# Patient Record
Sex: Male | Born: 2000
Health system: Southern US, Community
[De-identification: ages and names within clinical notes are randomized; demographics above are authoritative.]

## PROBLEM LIST (undated history)

## (undated) DIAGNOSIS — S43432A Superior glenoid labrum lesion of left shoulder, initial encounter: Secondary | ICD-10-CM

## (undated) DIAGNOSIS — S62109A Fracture of unspecified carpal bone, unspecified wrist, initial encounter for closed fracture: Secondary | ICD-10-CM

## (undated) DIAGNOSIS — S43492A Other sprain of left shoulder joint, initial encounter: Secondary | ICD-10-CM

## (undated) DIAGNOSIS — F909 Attention-deficit hyperactivity disorder, unspecified type: Secondary | ICD-10-CM

## (undated) DIAGNOSIS — S43431A Superior glenoid labrum lesion of right shoulder, initial encounter: Secondary | ICD-10-CM

## (undated) DIAGNOSIS — S43491A Other sprain of right shoulder joint, initial encounter: Secondary | ICD-10-CM

## (undated) HISTORY — PX: TONSILLECTOMY: SUR1361

---
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
## 2020-01-21 ENCOUNTER — Encounter (HOSPITAL_BASED_OUTPATIENT_CLINIC_OR_DEPARTMENT_OTHER): Payer: Self-pay | Admitting: Orthopedic Surgery

## 2020-01-21 ENCOUNTER — Other Ambulatory Visit: Payer: Self-pay

## 2020-01-24 ENCOUNTER — Other Ambulatory Visit (HOSPITAL_COMMUNITY)
Admission: RE | Admit: 2020-01-24 | Discharge: 2020-01-24 | Disposition: A | Discharge status: Home / Self Care | Payer: 59 | Source: Ambulatory Visit | Attending: Orthopedic Surgery | Admitting: Orthopedic Surgery

## 2020-01-24 DIAGNOSIS — Z01812 Encounter for preprocedural laboratory examination: Secondary | ICD-10-CM | POA: Insufficient documentation

## 2020-01-24 DIAGNOSIS — Z20822 Contact with and (suspected) exposure to covid-19: Secondary | ICD-10-CM | POA: Diagnosis not present

## 2020-01-24 LAB — SARS CORONAVIRUS 2 (TAT 6-24 HRS): SARS Coronavirus 2: NEGATIVE

## 2020-01-28 ENCOUNTER — Encounter (HOSPITAL_BASED_OUTPATIENT_CLINIC_OR_DEPARTMENT_OTHER)
Admission: RE | Disposition: A | Discharge status: Home / Self Care | Payer: Self-pay | Source: Home / Self Care | Attending: Orthopedic Surgery

## 2020-01-28 ENCOUNTER — Encounter (HOSPITAL_BASED_OUTPATIENT_CLINIC_OR_DEPARTMENT_OTHER): Payer: Self-pay | Admitting: Orthopedic Surgery

## 2020-01-28 ENCOUNTER — Ambulatory Visit (HOSPITAL_BASED_OUTPATIENT_CLINIC_OR_DEPARTMENT_OTHER)
Admission: RE | Admit: 2020-01-28 | Discharge: 2020-01-28 | Disposition: A | Discharge status: Home / Self Care | Payer: 59 | Attending: Orthopedic Surgery | Admitting: Orthopedic Surgery

## 2020-01-28 ENCOUNTER — Other Ambulatory Visit: Payer: Self-pay

## 2020-01-28 ENCOUNTER — Ambulatory Visit (HOSPITAL_BASED_OUTPATIENT_CLINIC_OR_DEPARTMENT_OTHER): Payer: 59 | Admitting: Anesthesiology

## 2020-01-28 DIAGNOSIS — Z79899 Other long term (current) drug therapy: Secondary | ICD-10-CM | POA: Insufficient documentation

## 2020-01-28 DIAGNOSIS — M25312 Other instability, left shoulder: Secondary | ICD-10-CM | POA: Insufficient documentation

## 2020-01-28 DIAGNOSIS — S43492A Other sprain of left shoulder joint, initial encounter: Secondary | ICD-10-CM | POA: Diagnosis present

## 2020-01-28 DIAGNOSIS — F909 Attention-deficit hyperactivity disorder, unspecified type: Secondary | ICD-10-CM | POA: Insufficient documentation

## 2020-01-28 DIAGNOSIS — S43432A Superior glenoid labrum lesion of left shoulder, initial encounter: Secondary | ICD-10-CM | POA: Diagnosis present

## 2020-01-28 HISTORY — DX: Other sprain of left shoulder joint, initial encounter: S43.492A

## 2020-01-28 HISTORY — DX: Superior glenoid labrum lesion of left shoulder, initial encounter: S43.432A

## 2020-01-28 HISTORY — PX: SHOULDER ARTHROSCOPY WITH BANKART REPAIR: SHX5673

## 2020-01-28 SURGERY — SHOULDER ARTHROSCOPY WITH BANKART REPAIR
Anesthesia: General | Site: Shoulder | Laterality: Left

## 2020-01-28 MED ORDER — CEFAZOLIN SODIUM-DEXTROSE 2-4 GM/100ML-% IV SOLN
INTRAVENOUS | Status: AC
  Filled 2020-01-28: qty 100

## 2020-01-28 MED ORDER — LACTATED RINGERS IV SOLN: INTRAVENOUS | Status: DC

## 2020-01-28 MED ORDER — MIDAZOLAM HCL 2 MG/2ML IJ SOLN
INTRAMUSCULAR | Status: AC
  Filled 2020-01-28: qty 2

## 2020-01-28 MED ORDER — MIDAZOLAM HCL 2 MG/2ML IJ SOLN
2.0000 mg | Freq: Once | INTRAMUSCULAR | Status: AC
  Administered 2020-01-28: 2 mg via INTRAVENOUS

## 2020-01-28 MED ORDER — BUPIVACAINE LIPOSOME 1.3 % IJ SUSP
INTRAMUSCULAR | Status: DC | PRN
  Administered 2020-01-28: 10 mL via PERINEURAL

## 2020-01-28 MED ORDER — FENTANYL CITRATE (PF) 100 MCG/2ML IJ SOLN
100.0000 ug | Freq: Once | INTRAMUSCULAR | Status: AC
  Administered 2020-01-28: 100 ug via INTRAVENOUS

## 2020-01-28 MED ORDER — OXYCODONE HCL 5 MG PO TABS: 5.0000 mg | ORAL_TABLET | Freq: Once | ORAL | Status: DC | PRN

## 2020-01-28 MED ORDER — ONDANSETRON HCL 4 MG/2ML IJ SOLN
INTRAMUSCULAR | Status: AC
  Filled 2020-01-28: qty 2

## 2020-01-28 MED ORDER — ONDANSETRON HCL 4 MG/2ML IJ SOLN
4.0000 mg | Freq: Once | INTRAMUSCULAR | Status: AC | PRN
  Administered 2020-01-28: 4 mg via INTRAVENOUS

## 2020-01-28 MED ORDER — EPHEDRINE 5 MG/ML INJ
INTRAVENOUS | Status: AC
  Filled 2020-01-28: qty 10

## 2020-01-28 MED ORDER — POVIDONE-IODINE 10 % EX SWAB: 2.0000 "application " | Freq: Once | CUTANEOUS | Status: DC

## 2020-01-28 MED ORDER — FENTANYL CITRATE (PF) 100 MCG/2ML IJ SOLN
INTRAMUSCULAR | Status: AC
  Filled 2020-01-28: qty 2

## 2020-01-28 MED ORDER — ROCURONIUM BROMIDE 10 MG/ML (PF) SYRINGE
PREFILLED_SYRINGE | INTRAVENOUS | Status: AC
  Filled 2020-01-28: qty 10

## 2020-01-28 MED ORDER — ROCURONIUM BROMIDE 100 MG/10ML IV SOLN
INTRAVENOUS | Status: DC | PRN
  Administered 2020-01-28: 50 mg via INTRAVENOUS

## 2020-01-28 MED ORDER — OXYCODONE HCL 5 MG PO TABS: 5.0000 mg | ORAL_TABLET | ORAL | 0 refills | Status: DC | PRN

## 2020-01-28 MED ORDER — DEXAMETHASONE SODIUM PHOSPHATE 4 MG/ML IJ SOLN
INTRAMUSCULAR | Status: DC | PRN
  Administered 2020-01-28: 10 mg via INTRAVENOUS

## 2020-01-28 MED ORDER — FENTANYL CITRATE (PF) 100 MCG/2ML IJ SOLN
INTRAMUSCULAR | Status: DC | PRN
  Administered 2020-01-28: 50 ug via INTRAVENOUS

## 2020-01-28 MED ORDER — MEPERIDINE HCL 25 MG/ML IJ SOLN: 6.2500 mg | INTRAMUSCULAR | Status: DC | PRN

## 2020-01-28 MED ORDER — BUPIVACAINE-EPINEPHRINE (PF) 0.5% -1:200000 IJ SOLN
INTRAMUSCULAR | Status: DC | PRN
Start: 2020-01-28 — End: 2020-01-28
  Administered 2020-01-28: 15 mL via PERINEURAL

## 2020-01-28 MED ORDER — PROPOFOL 10 MG/ML IV BOLUS
INTRAVENOUS | Status: AC
  Filled 2020-01-28: qty 20

## 2020-01-28 MED ORDER — SUGAMMADEX SODIUM 200 MG/2ML IV SOLN
INTRAVENOUS | Status: DC | PRN
Start: 2020-01-28 — End: 2020-01-28
  Administered 2020-01-28: 200 mg via INTRAVENOUS

## 2020-01-28 MED ORDER — ACETAMINOPHEN 160 MG/5ML PO SOLN: 325.0000 mg | ORAL | Status: DC | PRN

## 2020-01-28 MED ORDER — MIDAZOLAM HCL 5 MG/5ML IJ SOLN
INTRAMUSCULAR | Status: DC | PRN
  Administered 2020-01-28: 1 mg via INTRAVENOUS

## 2020-01-28 MED ORDER — LIDOCAINE HCL (CARDIAC) PF 100 MG/5ML IV SOSY
PREFILLED_SYRINGE | INTRAVENOUS | Status: DC | PRN
  Administered 2020-01-28: 50 mg via INTRAVENOUS

## 2020-01-28 MED ORDER — ACETAMINOPHEN 325 MG PO TABS: 325.0000 mg | ORAL_TABLET | ORAL | Status: DC | PRN

## 2020-01-28 MED ORDER — POVIDONE-IODINE 7.5 % EX SOLN
Freq: Once | CUTANEOUS | Status: DC
  Filled 2020-01-28: qty 118

## 2020-01-28 MED ORDER — PROMETHAZINE HCL 12.5 MG PO TABS
12.5000 mg | ORAL_TABLET | Freq: Four times a day (QID) | ORAL | 0 refills | Status: DC | PRN
Start: 2020-01-28 — End: 2020-11-04

## 2020-01-28 MED ORDER — PROPOFOL 10 MG/ML IV BOLUS
INTRAVENOUS | Status: DC | PRN
  Administered 2020-01-28: 200 mg via INTRAVENOUS

## 2020-01-28 MED ORDER — EPHEDRINE SULFATE 50 MG/ML IJ SOLN
INTRAMUSCULAR | Status: DC | PRN
  Administered 2020-01-28: 10 mg via INTRAVENOUS

## 2020-01-28 MED ORDER — CEFAZOLIN SODIUM-DEXTROSE 2-4 GM/100ML-% IV SOLN
2.0000 g | INTRAVENOUS | Status: AC
  Administered 2020-01-28: 2 g via INTRAVENOUS

## 2020-01-28 MED ORDER — OXYCODONE HCL 5 MG/5ML PO SOLN: 5.0000 mg | Freq: Once | ORAL | Status: DC | PRN

## 2020-01-28 MED ORDER — FENTANYL CITRATE (PF) 100 MCG/2ML IJ SOLN: 25.0000 ug | INTRAMUSCULAR | Status: DC | PRN

## 2020-01-28 SURGICAL SUPPLY — 67 items
ANCHOR SUT 1.8 FBRTK KNTLS 2SU (Anchor) ×3 IMPLANT
APL SKNCLS STERI-STRIP NONHPOA (GAUZE/BANDAGES/DRESSINGS)
BENZOIN TINCTURE PRP APPL 2/3 (GAUZE/BANDAGES/DRESSINGS) IMPLANT
BLADE EXCALIBUR 4.0X13 (MISCELLANEOUS) IMPLANT
BURR OVAL 8 FLU 5.0X13 (MISCELLANEOUS) IMPLANT
CANNULA 5.75X71 LONG (CANNULA) IMPLANT
CANNULA TWIST IN 8.25X7CM (CANNULA) IMPLANT
COVER WAND RF STERILE (DRAPES) IMPLANT
DECANTER SPIKE VIAL GLASS SM (MISCELLANEOUS) IMPLANT
DISSECTOR  3.8MM X 13CM (MISCELLANEOUS) ×2
DISSECTOR 3.8MM X 13CM (MISCELLANEOUS) ×1 IMPLANT
DRAPE SHOULDER BEACH CHAIR (DRAPES) ×2 IMPLANT
DRAPE U-SHAPE 47X51 STRL (DRAPES) ×4 IMPLANT
DRSG PAD ABDOMINAL 8X10 ST (GAUZE/BANDAGES/DRESSINGS) ×2 IMPLANT
DURAPREP 26ML APPLICATOR (WOUND CARE) ×2 IMPLANT
ELECT REM PT RETURN 9FT ADLT (ELECTROSURGICAL) ×2
ELECTRODE REM PT RTRN 9FT ADLT (ELECTROSURGICAL) ×1 IMPLANT
GAUZE SPONGE 4X4 12PLY STRL (GAUZE/BANDAGES/DRESSINGS) ×2 IMPLANT
GAUZE XEROFORM 1X8 LF (GAUZE/BANDAGES/DRESSINGS) ×2 IMPLANT
GLOVE BIO SURGEON STRL SZ7 (GLOVE) IMPLANT
GLOVE BIOGEL PI IND STRL 7.0 (GLOVE) IMPLANT
GLOVE BIOGEL PI IND STRL 7.5 (GLOVE) ×1 IMPLANT
GLOVE BIOGEL PI INDICATOR 7.0 (GLOVE) ×3
GLOVE BIOGEL PI INDICATOR 7.5 (GLOVE) ×1
GLOVE ECLIPSE 6.5 STRL STRAW (GLOVE) ×1 IMPLANT
GLOVE SS BIOGEL STRL SZ 7.5 (GLOVE) ×1 IMPLANT
GLOVE SUPERSENSE BIOGEL SZ 7.5 (GLOVE) ×1
GOWN STRL REUS W/ TWL LRG LVL3 (GOWN DISPOSABLE) ×2 IMPLANT
GOWN STRL REUS W/ TWL XL LVL3 (GOWN DISPOSABLE) ×2 IMPLANT
GOWN STRL REUS W/TWL LRG LVL3 (GOWN DISPOSABLE) ×4
GOWN STRL REUS W/TWL XL LVL3 (GOWN DISPOSABLE) ×4
KIT PUSHLOCK 2.9 HIP (KITS) IMPLANT
LASSO 90 CVE QUICKPAS (DISPOSABLE) ×2 IMPLANT
LASSO CRESCENT QUICKPASS (SUTURE) IMPLANT
LOOP 2 FIBERLINK CLOSED (SUTURE) IMPLANT
MANIFOLD NEPTUNE II (INSTRUMENTS) ×2 IMPLANT
NDL SAFETY ECLIPSE 18X1.5 (NEEDLE) ×1 IMPLANT
NEEDLE HYPO 18GX1.5 SHARP (NEEDLE) ×2
PACK ARTHROSCOPY DSU (CUSTOM PROCEDURE TRAY) ×2 IMPLANT
PACK BASIN DAY SURGERY FS (CUSTOM PROCEDURE TRAY) ×2 IMPLANT
PENCIL SMOKE EVACUATOR (MISCELLANEOUS) IMPLANT
PORT APPOLLO RF 90DEGREE MULTI (SURGICAL WAND) IMPLANT
SLEEVE SCD COMPRESS KNEE MED (MISCELLANEOUS) IMPLANT
SLING ARM FOAM STRAP LRG (SOFTGOODS) IMPLANT
SLING ULTRA III MED (ORTHOPEDIC SUPPLIES) IMPLANT
SPONGE LAP 4X18 RFD (DISPOSABLE) ×2 IMPLANT
STRIP CLOSURE SKIN 1/2X4 (GAUZE/BANDAGES/DRESSINGS) IMPLANT
SUCTION FRAZIER HANDLE 10FR (MISCELLANEOUS)
SUCTION TUBE FRAZIER 10FR DISP (MISCELLANEOUS) IMPLANT
SUT ETHILON 3 0 PS 1 (SUTURE) ×2 IMPLANT
SUT FIBERWIRE #2 38 T-5 BLUE (SUTURE)
SUT PROLENE 3 0 PS 2 (SUTURE) IMPLANT
SUT VIC AB 0 SH 27 (SUTURE) IMPLANT
SUT VIC AB 2-0 PS2 27 (SUTURE) IMPLANT
SUT VIC AB 2-0 SH 27 (SUTURE)
SUT VIC AB 2-0 SH 27XBRD (SUTURE) IMPLANT
SUTURE FIBERWR #2 38 T-5 BLUE (SUTURE) IMPLANT
SYR 5ML LL (SYRINGE) ×2 IMPLANT
SYR BULB EAR ULCER 3OZ GRN STR (SYRINGE) IMPLANT
TAPE HYPAFIX 6X30 (GAUZE/BANDAGES/DRESSINGS) IMPLANT
TAPE LABRALWHITE 1.5X36 (TAPE) IMPLANT
TAPE STRIPS DRAPE STRL (GAUZE/BANDAGES/DRESSINGS) ×2 IMPLANT
TAPE SUT LABRALTAP WHT/BLK (SUTURE) IMPLANT
TOWEL GREEN STERILE FF (TOWEL DISPOSABLE) ×2 IMPLANT
TUBE CONNECTING 20X1/4 (TUBING) IMPLANT
TUBING ARTHROSCOPY IRRIG 16FT (MISCELLANEOUS) ×2 IMPLANT
WATER STERILE IRR 1000ML POUR (IV SOLUTION) ×2 IMPLANT

## 2020-01-28 NOTE — H&P (Signed)
Tim Briggs is an 19 y.o. male.   Chief Complaint: left shoulder instability HPI: 19 yowm injured left shoulder swimming.  MRI arthogram showed bankart tear.     Past Medical History:  Diagnosis Date  . ADHD (attention deficit hyperactivity disorder)   . Bankart lesion of left shoulder   . Broken wrist    L wrist in 1st grade, R wrist last year    Past Surgical History:  Procedure Laterality Date  . TONSILLECTOMY      History reviewed. No pertinent family history. Social History:  reports that he has never smoked. He has never used smokeless tobacco. He reports that he does not drink alcohol and does not use drugs.  Allergies: No Known Allergies  Medications Prior to Admission  Medication Sig Dispense Refill  . amphetamine-dextroamphetamine (ADDERALL) 5 MG tablet Take 15 mg by mouth every morning.       No results found for this or any previous visit (from the past 48 hour(s)). No results found.  Review of Systems  Constitutional: Negative.   HENT: Negative.   Eyes: Negative.   Respiratory: Negative.   Cardiovascular: Negative.   Gastrointestinal: Negative.   Endocrine: Negative.   Genitourinary: Negative.   Musculoskeletal: Positive for joint swelling.  Skin: Negative.   Allergic/Immunologic: Negative.   Neurological: Negative.   Hematological: Negative.   Psychiatric/Behavioral: Negative.     Blood pressure 126/71, pulse 65, temperature 98 F (36.7 C), temperature source Oral, resp. rate 14, height 6\' 4"  (1.93 m), weight 70.7 kg, SpO2 100 %. Physical Exam Constitutional:      Appearance: Normal appearance. He is normal weight.  HENT:     Head: Normocephalic and atraumatic.     Right Ear: External ear normal.     Left Ear: External ear normal.     Nose: Nose normal.     Mouth/Throat:     Mouth: Mucous membranes are dry.  Eyes:     Extraocular Movements: Extraocular movements intact.     Pupils: Pupils are equal, round, and reactive to light.   Cardiovascular:     Rate and Rhythm: Normal rate and regular rhythm.     Heart sounds: Normal heart sounds.  Pulmonary:     Effort: Pulmonary effort is normal.  Abdominal:     General: Abdomen is flat.     Palpations: Abdomen is soft.  Genitourinary:    Comments: Not pertinent to current symptomatology therefore not examined. Musculoskeletal:        General: Swelling, tenderness and signs of injury present. Normal range of motion.     Cervical back: Neck supple.  Skin:    General: Skin is warm and dry.     Capillary Refill: Capillary refill takes less than 2 seconds.  Neurological:     General: No focal deficit present.     Mental Status: He is alert.  Psychiatric:        Mood and Affect: Mood normal.        Behavior: Behavior normal.      Assessment Principal Problem:   Bankart lesion of left shoulder   Plan Left shoulder arthroscopy with arthroscopic bankart repair.  The risks, benefits, and possible complications of the procedure were discussed in detail with the patient.  The patient is without question.  Donn Wilmot J Kylee Umana, PA-C 01/28/2020, 10:18 AM

## 2020-01-28 NOTE — Discharge Instructions (Signed)
Post Anesthesia Home Care Instructions  Activity: Get plenty of rest for the remainder of the day. A responsible individual must stay with you for 24 hours following the procedure.  For the next 24 hours, DO NOT: -Drive a car -Operate machinery -Drink alcoholic beverages -Take any medication unless instructed by your physician -Make any legal decisions or sign important papers.  Meals: Start with liquid foods such as gelatin or soup. Progress to regular foods as tolerated. Avoid greasy, spicy, heavy foods. If nausea and/or vomiting occur, drink only clear liquids until the nausea and/or vomiting subsides. Call your physician if vomiting continues.  Special Instructions/Symptoms: Your throat may feel dry or sore from the anesthesia or the breathing tube placed in your throat during surgery. If this causes discomfort, gargle with warm salt water. The discomfort should disappear within 24 hours.      Regional Anesthesia Blocks  1. Numbness or the inability to move the "blocked" extremity may last from 3-48 hours after placement. The length of time depends on the medication injected and your individual response to the medication. If the numbness is not going away after 48 hours, call your surgeon.  2. The extremity that is blocked will need to be protected until the numbness is gone and the  Strength has returned. Because you cannot feel it, you will need to take extra care to avoid injury. Because it may be weak, you may have difficulty moving it or using it. You may not know what position it is in without looking at it while the block is in effect.  3. For blocks in the legs and feet, returning to weight bearing and walking needs to be done carefully. You will need to wait until the numbness is entirely gone and the strength has returned. You should be able to move your leg and foot normally before you try and bear weight or walk. You will need someone to be with you when you first try to  ensure you do not fall and possibly risk injury.  4. Bruising and tenderness at the needle site are common side effects and will resolve in a few days.  5. Persistent numbness or new problems with movement should be communicated to the surgeon or the [facility] ([phone])/ [facility] ([phone]).Information for Discharge Teaching: EXPAREL (bupivacaine liposome injectable suspension)   Your surgeon or anesthesiologist gave you EXPAREL(bupivacaine) to help control your pain after surgery.   EXPAREL is a local anesthetic that provides pain relief by numbing the tissue around the surgical site.  EXPAREL is designed to release pain medication over time and can control pain for up to 72 hours.  Depending on how you respond to EXPAREL, you may require less pain medication during your recovery.  Possible side effects:  Temporary loss of sensation or ability to move in the area where bupivacaine was injected.  Nausea, vomiting, constipation  Rarely, numbness and tingling in your mouth or lips, lightheadedness, or anxiety may occur.  Call your doctor right away if you think you may be experiencing any of these sensations, or if you have other questions regarding possible side effects.  Follow all other discharge instructions given to you by your surgeon or nurse. Eat a healthy diet and drink plenty of water or other fluids.  If you return to the hospital for any reason within 96 hours following the administration of EXPAREL, it is important for health care providers to know that you have received this anesthetic. A   teal colored band has been placed on your arm with the date, time and amount of EXPAREL you have received in order to alert and inform your health care providers. Please leave this armband in place for the full 96 hours following administration, and then you may remove the band. 

## 2020-01-28 NOTE — Progress Notes (Signed)
Assisted Dr. Oddono with left, ultrasound guided, interscalene  block. Side rails up, monitors on throughout procedure. See vital signs in flow sheet. Tolerated Procedure well. 

## 2020-01-28 NOTE — Anesthesia Postprocedure Evaluation (Signed)
Anesthesia Post Note  Patient: Tim Briggs  Procedure(s) Performed: SHOULDER ARTHROSCOPY  DEBRIDEMENT WITH BANKART REPAIR (Left Shoulder)     Patient location during evaluation: PACU Anesthesia Type: General Level of consciousness: awake and alert Pain management: pain level controlled Vital Signs Assessment: post-procedure vital signs reviewed and stable Respiratory status: spontaneous breathing, nonlabored ventilation, respiratory function stable and patient connected to nasal cannula oxygen Cardiovascular status: blood pressure returned to baseline and stable Postop Assessment: no apparent nausea or vomiting Anesthetic complications: no   No complications documented.  Last Vitals:  Vitals:   01/28/20 1330 01/28/20 1400  BP: 128/75 118/67  Pulse: 69 70  Resp: 13 16  Temp:  (!) 36.3 C  SpO2: 97% 97%    Last Pain:  Vitals:   01/28/20 1400  TempSrc:   PainSc: 0-No pain                 Corryn Madewell

## 2020-01-28 NOTE — Anesthesia Procedure Notes (Signed)
Anesthesia Regional Block: Interscalene brachial plexus block   Pre-Anesthetic Checklist: ,, timeout performed, Correct Patient, Correct Site, Correct Laterality, Correct Procedure, Correct Position, site marked, Risks and benefits discussed,  Surgical consent,  Pre-op evaluation,  At surgeon's request and post-op pain management  Laterality: Left  Prep: chloraprep       Needles:  Injection technique: Single-shot  Needle Type: Echogenic Stimulator Needle     Needle Length: 5cm  Needle Gauge: 22     Additional Needles:   Procedures:, nerve stimulator,,, ultrasound used (permanent image in chart),,,,   Nerve Stimulator or Paresthesia:  Response: HAND, 0.45 mA,   Additional Responses:   Narrative:  Start time: 01/28/2020 10:24 AM End time: 01/28/2020 10:30 AM Injection made incrementally with aspirations every 5 mL.  Performed by: Personally  Anesthesiologist: Bethena Midget, MD  Additional Notes: Functioning IV was confirmed and monitors were applied.  A 46mm 22ga Arrow echogenic stimulator needle was used. Sterile prep and drape,hand hygiene and sterile gloves were used. Ultrasound guidance: relevant anatomy identified, needle position confirmed, local anesthetic spread visualized around nerve(s)., vascular puncture avoided.  Image printed for medical record. Negative aspiration and negative test dose prior to incremental administration of local anesthetic. The patient tolerated the procedure well.

## 2020-01-28 NOTE — Transfer of Care (Signed)
Immediate Anesthesia Transfer of Care Note  Patient: Tim Briggs  Procedure(s) Performed: SHOULDER ARTHROSCOPY  DEBRIDEMENT WITH BANKART REPAIR (Left Shoulder)  Patient Location: PACU  Anesthesia Type:General  Level of Consciousness: awake, alert  and oriented  Airway & Oxygen Therapy: Patient Spontanous Breathing and Patient connected to nasal cannula oxygen  Post-op Assessment: Report given to RN and Post -op Vital signs reviewed and stable  Post vital signs: Reviewed and stable  Last Vitals:  Vitals Value Taken Time  BP 128/83 01/28/20 1306  Temp    Pulse 70 01/28/20 1307  Resp    SpO2 100 % 01/28/20 1307  Vitals shown include unvalidated device data.  Last Pain:  Vitals:   01/28/20 1000  TempSrc: Oral  PainSc: 0-No pain         Complications: No complications documented.

## 2020-01-28 NOTE — Anesthesia Preprocedure Evaluation (Signed)
Anesthesia Evaluation  Patient identified by MRN, date of birth, ID band Patient awake    Reviewed: Allergy & Precautions, NPO status , Patient's Chart, lab work & pertinent test results  Airway Mallampati: II  TM Distance: >3 FB Neck ROM: Full    Dental no notable dental hx. (+) Teeth Intact   Pulmonary    Pulmonary exam normal breath sounds clear to auscultation       Cardiovascular Normal cardiovascular exam Rhythm:Regular Rate:Normal     Neuro/Psych PSYCHIATRIC DISORDERS ADHD    GI/Hepatic   Endo/Other    Renal/GU      Musculoskeletal   Abdominal   Peds  Hematology   Anesthesia Other Findings   Reproductive/Obstetrics                             Anesthesia Physical Anesthesia Plan  ASA: I  Anesthesia Plan: General   Post-op Pain Management: GA combined w/ Regional for post-op pain   Induction:   PONV Risk Score and Plan: 2 and Ondansetron and Midazolam  Airway Management Planned: Oral ETT  Additional Equipment:   Intra-op Plan:   Post-operative Plan: Extubation in OR  Informed Consent: I have reviewed the patients History and Physical, chart, labs and discussed the procedure including the risks, benefits and alternatives for the proposed anesthesia with the patient or authorized representative who has indicated his/her understanding and acceptance.     Dental advisory given  Plan Discussed with:   Anesthesia Plan Comments: (Discussed both nerve block for pain relief post-op and GA; including NV, sore throat, dental injury, and pulmonary complications)        Anesthesia Quick Evaluation

## 2020-01-28 NOTE — Op Note (Signed)
NAME: EBRAHIM, DEREMER MEDICAL RECORD [id number] ACCOUNT [id number] DATE OF BIRTH:14-Aug-2000 FACILITY: [facility] LOCATION: [facility] PHYSICIAN:Cecilio Ohlrich Salley Slaughter, MD  OPERATIVE REPORT  DATE OF PROCEDURE:  01/28/2020  PREOPERATIVE DIAGNOSIS:  Left shoulder acute traumatic Bankart tear with acute traumatic loose body and instability.  POSTOPERATIVE DIAGNOSIS:  Left shoulder acute traumatic Bankart tear with acute traumatic loose body and instability.   PROCEDURE:   1.  Left shoulder exam under anesthesia, followed by arthroscopically assisted Bankart repair using Arthrex FiberTaks x3. 2.  Left shoulder loose body excision.  SURGEON:  Salvatore Marvel, MD   ASSISTANT:  Julien Girt, PA.  ANESTHESIA:  General.  OPERATIVE TIME:  One hour.  COMPLICATIONS:  None.  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  Exam, x-rays and MRI have revealed a Bankart tear with loose body.  He has failed conservative care  and is now to undergo arthroscopy and repair.  DESCRIPTION OF PROCEDURE:  The patient was brought to the operating room on 01/28/2020 after an interscalene block was placed in the holding room by anesthesia.  He was placed on the operating table in supine position.  He received antibiotics  preoperatively for prophylaxis.  After being placed under general anesthesia, his left shoulder was examined.  He had full range of motion with anterior inferior instability on the left, which he did not have on the right.  No posterior instability.  He  was then placed in a beach chair position and his shoulder and arm was prepped using sterile DuraPrep and draped using sterile technique.  Time-out procedure was called and the correct left shoulder identified.  Initially through a posterior arthroscopic  portal, the arthroscope with a pump attached was placed and through an anterior portal, an  arthroscopic probe was placed.  On initial inspection, the articular cartilage in the glenohumeral joint was intact.  He was found to have a Bankart tear from the  6 o'clock to the 3 o'clock position on the anterior glenoid with a displaced tear.  The superior labrum and biceps tendon anchor and posterior labrum were intact.  The biceps tendon was intact and the rotator cuff was intact.  There was a significant  loose piece of labral tissue as a loose body, which was removed and debrided inferiorly.  At this point, through an accessory anteroinferior portal, the Bankart tear was repaired with 3 separate Arthrex FiberTaks.  Each of these were placed, one in the  5:30, one the 4:30 and one in the 3:30 position on the anteroinferior glenoid rim and each of the sutures were passed arthroscopically and then tightened into position, thus placing the anteroinferior labral capsular tissue back into its anatomic  position with excellent tightness and stability.  At this point, I felt that all pathology had been satisfactorily addressed.  The instruments were removed.  Portals were closed with 3-0 nylon suture.  Sterile dressings were applied and a sling, and the  patient awakened and taken to recovery room in stable condition.    FOLLOWUP CARE:  The patient will be followed as an outpatient on oxycodone and Phenergan.  See him back in the office in a week for sutures out and followup.  VN/NUANCE  D:01/28/2020 T:01/28/2020 JOB:[id number]

## 2020-01-28 NOTE — Anesthesia Procedure Notes (Addendum)
Procedure Name: Intubation Performed by: Cleda Clarks, CRNA Pre-anesthesia Checklist: Patient identified, Emergency Drugs available, Suction available and Patient being monitored Patient Re-evaluated:Patient Re-evaluated prior to induction Oxygen Delivery Method: Circle system utilized Preoxygenation: Pre-oxygenation with 100% oxygen Induction Type: IV induction Ventilation: Mask ventilation without difficulty Laryngoscope Size: Miller and 2 Grade View: Grade I Tube type: Oral Tube size: 7.0 mm Number of attempts: 1 Airway Equipment and Method: Stylet and Oral airway Placement Confirmation: ETT inserted through vocal cords under direct vision,  positive ETCO2 and breath sounds checked- equal and bilateral Secured at: 24 cm Tube secured with: Tape Dental Injury: Teeth and Oropharynx as per pre-operative assessment

## 2020-01-29 ENCOUNTER — Encounter (HOSPITAL_BASED_OUTPATIENT_CLINIC_OR_DEPARTMENT_OTHER): Payer: Self-pay | Admitting: Orthopedic Surgery

## 2020-04-20 NOTE — Op Note (Signed)
NAME: Tim Briggs, Tim Briggs MEDICAL RECORD [id number] ACCOUNT [id number] DATE OF BIRTH:Dec 30, 2000 FACILITY: [facility] LOCATION: [facility] PHYSICIAN:Abbe Bula Salley Slaughter, MD  OPERATIVE REPORT  DATE OF PROCEDURE:  01/28/2020  ADDENDUM  This is an addendum to the operative note from surgery date of 01/28/2020 on this patient for left shoulder instability with Bankart repair and removal of the loose body.  Of note is that the loose body in the patient's shoulder measured 2 x 2 cm and the  anterior portal had to be extended approximately 1 cm to remove this loose body.  VN/NUANCE  D:04/20/2020 T:04/20/2020 JOB:[id number]

## 2020-10-14 ENCOUNTER — Encounter (HOSPITAL_BASED_OUTPATIENT_CLINIC_OR_DEPARTMENT_OTHER): Payer: Self-pay | Admitting: Orthopedic Surgery

## 2020-10-14 DIAGNOSIS — S43491A Other sprain of right shoulder joint, initial encounter: Secondary | ICD-10-CM | POA: Diagnosis present

## 2020-10-14 NOTE — H&P (Signed)
Tim Briggs is an 20 y.o. male.   Chief Complaint: right shoulder instability HPI: "Tim Briggs" is a 20 year old who is seen for a follow-up evaluation from his left shoulder Bankart repair. He is 5 months post operative and is doing well in therapy. His right shoulder continues to bother him as well. He has been in physical therapy for the past four months  on his right shoulder as well. This has a known labral injury also. The right shoulder had significant trauma to it while swimming and playing lacrosse eight months ago. He has been in aggressive physical therapy for the right shoulder. He underwent an MRI arthrogram of the right shoulder on 06-25-20 which revealed a Bankart tear of the anterior/ inferior labrum.    Past Medical History:  Diagnosis Date  . ADHD (attention deficit hyperactivity disorder)   . Bankart lesion of left shoulder   . Bankart lesion of right shoulder   . Broken wrist    L wrist in 1st grade, R wrist last year    Past Surgical History:  Procedure Laterality Date  . SHOULDER ARTHROSCOPY WITH BANKART REPAIR Left 01/28/2020   Procedure: SHOULDER ARTHROSCOPY  DEBRIDEMENT WITH BANKART REPAIR;  Surgeon: Salvatore Marvel, MD;  [facility]:  [facility];  Service: Orthopedics;  Laterality: Left;  SCALENE BLOCK  . TONSILLECTOMY      No family history on file. Social History:  reports that he has never smoked. He has never used smokeless tobacco. He reports that he does not drink alcohol and does not use drugs.  Allergies: No Known Allergies  No medications prior to admission.    No results found for this or any previous visit (from the past 48 hour(s)). No results found.  Review of Systems  Constitutional: Negative.   HENT: Negative.   Eyes: Negative.   Respiratory: Negative.   Cardiovascular: Negative.   Gastrointestinal: Negative.   Endocrine: Negative.   Genitourinary: Negative.   Musculoskeletal:       Right shoulder pain and instability   Allergic/Immunologic: Negative.   Neurological: Negative.   Hematological: Negative.   Psychiatric/Behavioral: Negative.     Blood pressure 112/72, pulse 82, temperature (!) 97.2 F (36.2 C), height 6\' 4"  (1.93 m), weight 73.5 kg. Physical Exam Constitutional:      Appearance: Normal appearance.  HENT:     Head: Normocephalic and atraumatic.     Right Ear: External ear normal.     Left Ear: External ear normal.     Nose: Nose normal.     Mouth/Throat:     Mouth: Mucous membranes are moist.  Eyes:     Pupils: Pupils are equal, round, and reactive to light.  Cardiovascular:     Rate and Rhythm: Normal rate.     Pulses: Normal pulses.  Pulmonary:     Effort: Pulmonary effort is normal.  Abdominal:     Palpations: Abdomen is soft.  Musculoskeletal:     Cervical back: Neck supple.     Comments: Examination of the  left shoulder reveals a  near full range of motion without pain. Strength is 4+/5. No instability. Examination of the right shoulder  revealed significant pain.  Full range of motion. Positive anterior apprehension and subluxability is noted.    Skin:    General: Skin is warm and dry.     Capillary Refill: Capillary refill takes less than 2 seconds.  Neurological:     General: No focal deficit present.     Mental  Status: He is alert.  Psychiatric:        Mood and Affect: Mood normal.        Behavior: Behavior normal.      Assessment Active Problems:   Bankart lesion of right shoulder  Plan  I talked to him and his parents about this in detail. I do think he will need to undergo a right shoulder arthroscopy with Bankart repair but he wants to hold off on this until May when he finishes his freshman  year at [employer]. He will wean out of physical therapy for the next 1-2 weeks for his left shoulder but will continue with rehab exercises for both of his shoulders. We will also get him a SAWA brace for his left shoulder to use when he goes snow skiing.  The risks, complications and benefits of a right shoulder arthroscopic Bankart surgery have been discussed with him in detail and they understand this completely.   Dian Minahan J Kriti Katayama, PA-C 10/14/2020, 10:07 AM

## 2020-11-04 ENCOUNTER — Other Ambulatory Visit: Payer: Self-pay

## 2020-11-04 ENCOUNTER — Encounter (HOSPITAL_BASED_OUTPATIENT_CLINIC_OR_DEPARTMENT_OTHER): Payer: Self-pay | Admitting: Orthopedic Surgery

## 2020-11-10 ENCOUNTER — Other Ambulatory Visit (HOSPITAL_COMMUNITY)
Admission: RE | Admit: 2020-11-10 | Discharge: 2020-11-10 | Disposition: A | Discharge status: Home / Self Care | Payer: 59 | Source: Ambulatory Visit | Attending: Orthopedic Surgery | Admitting: Orthopedic Surgery

## 2020-11-10 DIAGNOSIS — Z20822 Contact with and (suspected) exposure to covid-19: Secondary | ICD-10-CM | POA: Diagnosis not present

## 2020-11-10 DIAGNOSIS — Z01812 Encounter for preprocedural laboratory examination: Secondary | ICD-10-CM | POA: Insufficient documentation

## 2020-11-11 LAB — SARS CORONAVIRUS 2 (TAT 6-24 HRS): SARS Coronavirus 2: NEGATIVE

## 2020-11-12 ENCOUNTER — Encounter (HOSPITAL_BASED_OUTPATIENT_CLINIC_OR_DEPARTMENT_OTHER)
Admission: RE | Disposition: A | Discharge status: Home / Self Care | Payer: Self-pay | Source: Home / Self Care | Attending: Orthopedic Surgery

## 2020-11-12 ENCOUNTER — Other Ambulatory Visit: Payer: Self-pay

## 2020-11-12 ENCOUNTER — Ambulatory Visit (HOSPITAL_BASED_OUTPATIENT_CLINIC_OR_DEPARTMENT_OTHER): Payer: 59 | Admitting: Anesthesiology

## 2020-11-12 ENCOUNTER — Ambulatory Visit (HOSPITAL_BASED_OUTPATIENT_CLINIC_OR_DEPARTMENT_OTHER)
Admission: RE | Admit: 2020-11-12 | Discharge: 2020-11-12 | Disposition: A | Discharge status: Home / Self Care | Payer: 59 | Attending: Orthopedic Surgery | Admitting: Orthopedic Surgery

## 2020-11-12 ENCOUNTER — Encounter (HOSPITAL_BASED_OUTPATIENT_CLINIC_OR_DEPARTMENT_OTHER): Payer: Self-pay | Admitting: Orthopedic Surgery

## 2020-11-12 DIAGNOSIS — S43431A Superior glenoid labrum lesion of right shoulder, initial encounter: Secondary | ICD-10-CM | POA: Diagnosis not present

## 2020-11-12 DIAGNOSIS — M25311 Other instability, right shoulder: Secondary | ICD-10-CM | POA: Diagnosis not present

## 2020-11-12 DIAGNOSIS — S43004A Unspecified dislocation of right shoulder joint, initial encounter: Secondary | ICD-10-CM | POA: Diagnosis not present

## 2020-11-12 DIAGNOSIS — M24011 Loose body in right shoulder: Secondary | ICD-10-CM | POA: Insufficient documentation

## 2020-11-12 DIAGNOSIS — Y9379 Activity, other specified sports and athletics: Secondary | ICD-10-CM | POA: Insufficient documentation

## 2020-11-12 DIAGNOSIS — S43491A Other sprain of right shoulder joint, initial encounter: Secondary | ICD-10-CM | POA: Diagnosis present

## 2020-11-12 HISTORY — DX: Superior glenoid labrum lesion of right shoulder, initial encounter: S43.431A

## 2020-11-12 HISTORY — PX: SHOULDER ARTHROSCOPY WITH CAPSULORRHAPHY: SHX6454

## 2020-11-12 HISTORY — DX: Other sprain of right shoulder joint, initial encounter: S43.491A

## 2020-11-12 SURGERY — SHOULDER ATHROSCOPY WITH CAPSULORRHAPHY
Anesthesia: General | Site: Shoulder | Laterality: Right

## 2020-11-12 MED ORDER — HYDROMORPHONE HCL 1 MG/ML IJ SOLN: 0.2500 mg | INTRAMUSCULAR | Status: DC | PRN

## 2020-11-12 MED ORDER — LACTATED RINGERS IV SOLN: INTRAVENOUS | Status: DC

## 2020-11-12 MED ORDER — PROMETHAZINE HCL 12.5 MG PO TABS: 12.5000 mg | ORAL_TABLET | Freq: Three times a day (TID) | ORAL | 0 refills | Status: AC | PRN

## 2020-11-12 MED ORDER — PROPOFOL 10 MG/ML IV BOLUS
INTRAVENOUS | Status: AC
  Filled 2020-11-12: qty 20

## 2020-11-12 MED ORDER — MIDAZOLAM HCL 2 MG/2ML IJ SOLN
2.0000 mg | Freq: Once | INTRAMUSCULAR | Status: AC
  Administered 2020-11-12: 2 mg via INTRAVENOUS

## 2020-11-12 MED ORDER — BUPIVACAINE HCL (PF) 0.5 % IJ SOLN
INTRAMUSCULAR | Status: DC | PRN
  Administered 2020-11-12: 20 mL via PERINEURAL

## 2020-11-12 MED ORDER — CEFAZOLIN SODIUM-DEXTROSE 2-4 GM/100ML-% IV SOLN
2.0000 g | INTRAVENOUS | Status: AC
  Administered 2020-11-12: 2 g via INTRAVENOUS

## 2020-11-12 MED ORDER — MEPERIDINE HCL 25 MG/ML IJ SOLN: 6.2500 mg | INTRAMUSCULAR | Status: DC | PRN

## 2020-11-12 MED ORDER — AMISULPRIDE (ANTIEMETIC) 5 MG/2ML IV SOLN: 10.0000 mg | Freq: Once | INTRAVENOUS | Status: DC | PRN

## 2020-11-12 MED ORDER — ONDANSETRON HCL 4 MG/2ML IJ SOLN
INTRAMUSCULAR | Status: DC | PRN
  Administered 2020-11-12: 4 mg via INTRAVENOUS

## 2020-11-12 MED ORDER — FENTANYL CITRATE (PF) 100 MCG/2ML IJ SOLN
INTRAMUSCULAR | Status: AC
  Filled 2020-11-12: qty 2

## 2020-11-12 MED ORDER — CEFAZOLIN SODIUM-DEXTROSE 2-4 GM/100ML-% IV SOLN
INTRAVENOUS | Status: AC
  Filled 2020-11-12: qty 100

## 2020-11-12 MED ORDER — LIDOCAINE 2% (20 MG/ML) 5 ML SYRINGE
INTRAMUSCULAR | Status: AC
  Filled 2020-11-12: qty 5

## 2020-11-12 MED ORDER — PROMETHAZINE HCL 25 MG/ML IJ SOLN: 6.2500 mg | INTRAMUSCULAR | Status: DC | PRN

## 2020-11-12 MED ORDER — ROCURONIUM BROMIDE 100 MG/10ML IV SOLN
INTRAVENOUS | Status: DC | PRN
  Administered 2020-11-12: 70 mg via INTRAVENOUS

## 2020-11-12 MED ORDER — LIDOCAINE HCL (CARDIAC) PF 100 MG/5ML IV SOSY
PREFILLED_SYRINGE | INTRAVENOUS | Status: DC | PRN
  Administered 2020-11-12: 40 mg via INTRAVENOUS

## 2020-11-12 MED ORDER — FENTANYL CITRATE (PF) 100 MCG/2ML IJ SOLN
100.0000 ug | Freq: Once | INTRAMUSCULAR | Status: AC
  Administered 2020-11-12: 100 ug via INTRAVENOUS

## 2020-11-12 MED ORDER — FENTANYL CITRATE (PF) 100 MCG/2ML IJ SOLN
INTRAMUSCULAR | Status: DC | PRN
  Administered 2020-11-12: 50 ug via INTRAVENOUS

## 2020-11-12 MED ORDER — OXYCODONE HCL 5 MG/5ML PO SOLN: 5.0000 mg | Freq: Once | ORAL | Status: DC | PRN

## 2020-11-12 MED ORDER — OXYCODONE HCL 5 MG PO TABS: 5.0000 mg | ORAL_TABLET | ORAL | 0 refills | Status: AC | PRN

## 2020-11-12 MED ORDER — POVIDONE-IODINE 10 % EX SWAB
2.0000 "application " | Freq: Once | CUTANEOUS | Status: AC
  Administered 2020-11-12: 2 via TOPICAL

## 2020-11-12 MED ORDER — BUPIVACAINE LIPOSOME 1.3 % IJ SUSP
INTRAMUSCULAR | Status: DC | PRN
  Administered 2020-11-12: 10 mL via PERINEURAL

## 2020-11-12 MED ORDER — PROPOFOL 10 MG/ML IV BOLUS
INTRAVENOUS | Status: DC | PRN
  Administered 2020-11-12: 150 mg via INTRAVENOUS

## 2020-11-12 MED ORDER — ONDANSETRON HCL 4 MG/2ML IJ SOLN
INTRAMUSCULAR | Status: AC
  Filled 2020-11-12: qty 2

## 2020-11-12 MED ORDER — SUGAMMADEX SODIUM 200 MG/2ML IV SOLN
INTRAVENOUS | Status: DC | PRN
  Administered 2020-11-12: 150 mg via INTRAVENOUS

## 2020-11-12 MED ORDER — ROCURONIUM BROMIDE 10 MG/ML (PF) SYRINGE
PREFILLED_SYRINGE | INTRAVENOUS | Status: AC
  Filled 2020-11-12: qty 10

## 2020-11-12 MED ORDER — DEXAMETHASONE SODIUM PHOSPHATE 10 MG/ML IJ SOLN
8.0000 mg | Freq: Once | INTRAMUSCULAR | Status: AC
  Administered 2020-11-12: 8 mg via INTRAVENOUS

## 2020-11-12 MED ORDER — OXYCODONE HCL 5 MG PO TABS: 5.0000 mg | ORAL_TABLET | Freq: Once | ORAL | Status: DC | PRN

## 2020-11-12 MED ORDER — MIDAZOLAM HCL 2 MG/2ML IJ SOLN
INTRAMUSCULAR | Status: AC
  Filled 2020-11-12: qty 2

## 2020-11-12 MED ORDER — DEXAMETHASONE SODIUM PHOSPHATE 10 MG/ML IJ SOLN
INTRAMUSCULAR | Status: AC
  Filled 2020-11-12: qty 1

## 2020-11-12 SURGICAL SUPPLY — 46 items
ANCHOR SUT 1.8 FBRTK KNTLS 2SU (Anchor) ×3 IMPLANT
BLADE CLIPPER SURG (BLADE) IMPLANT
BLADE EXCALIBUR 4.0X13 (MISCELLANEOUS) IMPLANT
BNDG COHESIVE 4X5 TAN STRL (GAUZE/BANDAGES/DRESSINGS) IMPLANT
BURR OVAL 8 FLU 5.0X13 (MISCELLANEOUS) IMPLANT
CANNULA 5.75X71 LONG (CANNULA) ×2 IMPLANT
CANNULA TWIST IN 8.25X7CM (CANNULA) ×2 IMPLANT
COVER WAND RF STERILE (DRAPES) IMPLANT
DISSECTOR  3.8MM X 13CM (MISCELLANEOUS) ×2
DISSECTOR 3.8MM X 13CM (MISCELLANEOUS) ×1 IMPLANT
DRAPE SHOULDER BEACH CHAIR (DRAPES) ×2 IMPLANT
DRAPE U-SHAPE 47X51 STRL (DRAPES) ×4 IMPLANT
DRSG PAD ABDOMINAL 8X10 ST (GAUZE/BANDAGES/DRESSINGS) ×2 IMPLANT
DURAPREP 26ML APPLICATOR (WOUND CARE) ×2 IMPLANT
GAUZE SPONGE 4X4 12PLY STRL (GAUZE/BANDAGES/DRESSINGS) ×2 IMPLANT
GAUZE XEROFORM 1X8 LF (GAUZE/BANDAGES/DRESSINGS) ×2 IMPLANT
GLOVE SURG ENC MOIS LTX SZ7 (GLOVE) ×1 IMPLANT
GLOVE SURG MICRO LTX SZ7.5 (GLOVE) ×2 IMPLANT
GLOVE SURG UNDER POLY LF SZ7 (GLOVE) ×1 IMPLANT
GLOVE SURG UNDER POLY LF SZ7.5 (GLOVE) ×2 IMPLANT
GOWN STRL REUS W/ TWL LRG LVL3 (GOWN DISPOSABLE) ×2 IMPLANT
GOWN STRL REUS W/ TWL XL LVL3 (GOWN DISPOSABLE) ×1 IMPLANT
GOWN STRL REUS W/TWL LRG LVL3 (GOWN DISPOSABLE) ×2
GOWN STRL REUS W/TWL XL LVL3 (GOWN DISPOSABLE) ×4
KIT STR SPEAR 1.8 FBRTK DISP (KITS) ×1 IMPLANT
LASSO 90 CVE QUICKPAS (DISPOSABLE) ×1 IMPLANT
LASSO CRESCENT QUICKPASS (SUTURE) IMPLANT
MANIFOLD NEPTUNE II (INSTRUMENTS) ×2 IMPLANT
NDL SAFETY ECLIPSE 18X1.5 (NEEDLE) ×1 IMPLANT
NEEDLE HYPO 18GX1.5 SHARP (NEEDLE) ×2
PACK ARTHROSCOPY DSU (CUSTOM PROCEDURE TRAY) ×2 IMPLANT
PACK BASIN DAY SURGERY FS (CUSTOM PROCEDURE TRAY) ×2 IMPLANT
PAD ALCOHOL SWAB (MISCELLANEOUS) ×4 IMPLANT
PORT APPOLLO RF 90DEGREE MULTI (SURGICAL WAND) IMPLANT
SLEEVE SCD COMPRESS KNEE MED (STOCKING) IMPLANT
SLING ARM FOAM STRAP LRG (SOFTGOODS) IMPLANT
SLING ULTRA III MED (ORTHOPEDIC SUPPLIES) IMPLANT
SUT ETHILON 3 0 PS 1 (SUTURE) ×2 IMPLANT
SUT PROLENE 3 0 PS 2 (SUTURE) IMPLANT
SYR 5ML LL (SYRINGE) ×2 IMPLANT
TAPE HYPAFIX 6X30 (GAUZE/BANDAGES/DRESSINGS) IMPLANT
TAPE STRIPS DRAPE STRL (GAUZE/BANDAGES/DRESSINGS) ×2 IMPLANT
TAPE SUT LABRALTAP WHT/BLK (SUTURE) IMPLANT
TOWEL GREEN STERILE FF (TOWEL DISPOSABLE) ×4 IMPLANT
TUBE CONNECTING 20X1/4 (TUBING) ×3 IMPLANT
TUBING ARTHROSCOPY IRRIG 16FT (MISCELLANEOUS) ×2 IMPLANT

## 2020-11-12 NOTE — Interval H&P Note (Signed)
History and Physical Interval Note:  11/12/2020 12:19 PM  Tim Briggs  has presented today for surgery, with the diagnosis of RIGHT SHOULDER DISLOCATION.  The various methods of treatment have been discussed with the patient and family. After consideration of risks, benefits and other options for treatment, the patient has consented to  Procedure(s): RIGHT SHOULDER ATHROSCOPY WITH CAPSULORRHAPHY (Right) as a surgical intervention.  The patient's history has been reviewed, patient examined, no change in status, stable for surgery.  I have reviewed the patient's chart and labs.  Questions were answered to the patient's satisfaction.     Nilda Simmer

## 2020-11-12 NOTE — Anesthesia Procedure Notes (Signed)
Procedure Name: Intubation Date/Time: 11/12/2020 12:43 PM Performed by: Thornell Mule, CRNA Pre-anesthesia Checklist: Patient identified, Emergency Drugs available, Suction available and Patient being monitored Patient Re-evaluated:Patient Re-evaluated prior to induction Oxygen Delivery Method: Circle system utilized Preoxygenation: Pre-oxygenation with 100% oxygen Induction Type: IV induction Ventilation: Mask ventilation without difficulty Laryngoscope Size: Miller and 3 Grade View: Grade I Tube type: Oral Tube size: 7.5 mm Number of attempts: 1 Airway Equipment and Method: Stylet and Oral airway Placement Confirmation: ETT inserted through vocal cords under direct vision,  positive ETCO2 and breath sounds checked- equal and bilateral Secured at: 23 cm Tube secured with: Tape Dental Injury: Teeth and Oropharynx as per pre-operative assessment

## 2020-11-12 NOTE — Discharge Instructions (Signed)
[facility]ou[facility] arthroscopy and Labral Repair (Bankart and/or SLAP) Care After Instructions Refer to this sheet in the next few weeks. These discharge instructions provide you with general information on caring for yourself after you leave the hospital. Your caregiver may also give you specific instructions. Your treatment has been planned according to the most current medical practices available, but unavoidable complications sometimes occur. If you have any problems or questions after discharge, please call your caregiver. HOME INSTRUCTIONS You may resume a normal diet and activities as directed.  You will start physical therapy 4 weeks after surgery Take showers instead of baths until informed otherwise.  Change bandages (dressings) in 3 days.  Swab wounds daily with betadine.  Wash shoulder with soap and water.  Pat dry.  Cover wounds with bandaids. Only take over-the-counter or prescription medicines for pain, discomfort, or fever as directed by your caregiver. DO NOT TAKE ANY ANTI-INFLAMMATORIES.  (NO ADVIL, IBUPROFEN, ALEVE, NAPROSYN) Wear your sling for the next 6 weeks unless otherwise instructed. Eat a well-balanced diet.  Avoid lifting or driving until you are instructed otherwise.  Make an appointment to see your caregiver for stitches (suture) or staple removal as directed.   SEEK MEDICAL CARE IF: You have swelling of your calf or leg.  You develop shortness of breath or chest pain.  You have redness, swelling, or increasing pain in the wound.  There is pus or any unusual drainage coming from the surgical site.  You notice a bad smell coming from the surgical site or dressing.  The surgical site breaks open after sutures or staples have been removed.  There is persistent bleeding from the suture or staple line.  You are getting worse or are not improving.  You have any other questions or concerns.  SEEK IMMEDIATE MEDICAL CARE IF:  You have a fever greater than 101 You develop a rash.   You have difficulty breathing.  You develop any reaction or side effects to medicines given.  Your knee motion is decreasing rather than improving.  MAKE SURE YOU:  Understand these instructions.  Will watch your condition.  Will get help right away if you are not doing well or get worse.  Post Anesthesia Home Care Instructions  Activity: Get plenty of rest for the remainder of the day. A responsible individual must stay with you for 24 hours following the procedure.  For the next 24 hours, DO NOT: -Drive a car -Advertising copywriter -Drink alcoholic beverages -Take any medication unless instructed by your physician -Make any legal decisions or sign important papers.  Meals: Start with liquid foods such as gelatin or soup. Progress to regular foods as tolerated. Avoid greasy, spicy, heavy foods. If nausea and/or vomiting occur, drink only clear liquids until the nausea and/or vomiting subsides. Call your physician if vomiting continues.  Special Instructions/Symptoms: Your throat may feel dry or sore from the anesthesia or the breathing tube placed in your throat during surgery. If this causes discomfort, gargle with warm salt water. The discomfort should disappear within 24 hours.  If you had a scopolamine patch placed behind your ear for the management of post- operative nausea and/or vomiting:  1. The medication in the patch is effective for 72 hours, after which it should be removed.  Wrap patch in a tissue and discard in the trash. Wash hands thoroughly with soap and water. 2. You may remove the patch earlier than 72 hours if you experience unpleasant side effects which may include dry mouth, dizziness or visual  disturbances. 3. Avoid touching the patch. Wash your hands with soap and water after contact with the patch.   Regional Anesthesia Blocks  1. Numbness or the inability to move the "blocked" extremity may last from 3-48 hours after placement. The length of time depends on  the medication injected and your individual response to the medication. If the numbness is not going away after 48 hours, call your surgeon.  2. The extremity that is blocked will need to be protected until the numbness is gone and the  Strength has returned. Because you cannot feel it, you will need to take extra care to avoid injury. Because it may be weak, you may have difficulty moving it or using it. You may not know what position it is in without looking at it while the block is in effect.  3. For blocks in the legs and feet, returning to weight bearing and walking needs to be done carefully. You will need to wait until the numbness is entirely gone and the strength has returned. You should be able to move your leg and foot normally before you try and bear weight or walk. You will need someone to be with you when you first try to ensure you do not fall and possibly risk injury.  4. Bruising and tenderness at the needle site are common side effects and will resolve in a few days.  5. Persistent numbness or new problems with movement should be communicated to the surgeon or the [facility] [phone] [facility] [phone]).    Information for Discharge Teaching: EXPAREL (bupivacaine liposome injectable suspension)   Your surgeon or anesthesiologist gave you EXPAREL(bupivacaine) to help control your pain after surgery.   EXPAREL is a local anesthetic that provides pain relief by numbing the tissue around the surgical site.  EXPAREL is designed to release pain medication over time and can control pain for up to 72 hours.  Depending on how you respond to EXPAREL, you may require less pain medication during your recovery.  Possible side effects:  Temporary loss of sensation or ability to move in the area where bupivacaine was injected.  Nausea, vomiting, constipation  Rarely, numbness and tingling in your mouth or lips, lightheadedness, or anxiety may  occur.  Call your doctor right away if you think you may be experiencing any of these sensations, or if you have other questions regarding possible side effects.  Follow all other discharge instructions given to you by your surgeon or nurse. Eat a healthy diet and drink plenty of water or other fluids.  If you return to the hospital for any reason within 96 hours following the administration of EXPAREL, it is important for health care providers to know that you have received this anesthetic. A teal colored band has been placed on your arm with the date, time and amount of EXPAREL you have received in order to alert and inform your health care providers. Please leave this armband in place for the full 96 hours following administration, and then you may remove the band.

## 2020-11-12 NOTE — Anesthesia Preprocedure Evaluation (Addendum)
Anesthesia Evaluation  Patient identified by MRN, date of birth, ID band Patient awake    Reviewed: Allergy & Precautions, NPO status , Patient's Chart, lab work & pertinent test results  Airway Mallampati: II  TM Distance: >3 FB Neck ROM: Full    Dental no notable dental hx. (+) Teeth Intact   Pulmonary    Pulmonary exam normal breath sounds clear to auscultation       Cardiovascular Normal cardiovascular exam Rhythm:Regular Rate:Normal     Neuro/Psych PSYCHIATRIC DISORDERS ADHD    GI/Hepatic   Endo/Other    Renal/GU      Musculoskeletal   Abdominal   Peds  (+) ADHD Hematology   Anesthesia Other Findings   Reproductive/Obstetrics                             Anesthesia Physical  Anesthesia Plan  ASA: II  Anesthesia Plan: General   Post-op Pain Management: GA combined w/ Regional for post-op pain   Induction:   PONV Risk Score and Plan: 2 and Ondansetron and Midazolam  Airway Management Planned: Oral ETT  Additional Equipment:   Intra-op Plan:   Post-operative Plan: Extubation in OR  Informed Consent: I have reviewed the patients History and Physical, chart, labs and discussed the procedure including the risks, benefits and alternatives for the proposed anesthesia with the patient or authorized representative who has indicated his/her understanding and acceptance.     Dental advisory given  Plan Discussed with:   Anesthesia Plan Comments: (Discussed both nerve block for pain relief post-op and GA; including NV, sore throat, dental injury, and pulmonary complications)        Anesthesia Quick Evaluation

## 2020-11-12 NOTE — Anesthesia Postprocedure Evaluation (Signed)
Anesthesia Post Note  Patient: Tim Briggs  Procedure(s) Performed: RIGHT SHOULDER ATHROSCOPY WITH CAPSULORRHAPHY (Right Shoulder)     Patient location during evaluation: PACU Anesthesia Type: General and Regional Level of consciousness: awake and alert Pain management: pain level controlled Vital Signs Assessment: post-procedure vital signs reviewed and stable Respiratory status: spontaneous breathing, nonlabored ventilation, respiratory function stable and patient connected to nasal cannula oxygen Cardiovascular status: blood pressure returned to baseline and stable Postop Assessment: no apparent nausea or vomiting Anesthetic complications: no   No complications documented.  Last Vitals:  Vitals:   11/12/20 1415 11/12/20 1431  BP: 109/72 110/70  Pulse: (!) 47 61  Resp: 12 16  Temp:    SpO2: 100% 100%    Last Pain:  Vitals:   11/12/20 1431  TempSrc:   PainSc: 0-No pain                 [name] L Xiadani Damman

## 2020-11-12 NOTE — Transfer of Care (Signed)
Immediate Anesthesia Transfer of Care Note  Patient: Tim Briggs  Procedure(s) Performed: RIGHT SHOULDER ATHROSCOPY WITH CAPSULORRHAPHY (Right Shoulder)  Patient Location: PACU  Anesthesia Type:General  Level of Consciousness: drowsy, patient cooperative and responds to stimulation  Airway & Oxygen Therapy: Patient Spontanous Breathing and Patient connected to face mask oxygen  Post-op Assessment: Report given to RN and Post -op Vital signs reviewed and stable  Post vital signs: Reviewed and stable  Last Vitals:  Vitals Value Taken Time  BP 119/71 11/12/20 1348  Temp    Pulse 70 11/12/20 1349  Resp 13 11/12/20 1349  SpO2 100 % 11/12/20 1349  Vitals shown include unvalidated device data.  Last Pain:  Vitals:   11/12/20 1126  TempSrc: Oral  PainSc: 0-No pain      Patients Stated Pain Goal: 4 (11/12/20 1126)  Complications: No complications documented.

## 2020-11-12 NOTE — Progress Notes (Signed)
Assisted Dr. Miller with right, ultrasound guided, interscalene  block. Side rails up, monitors on throughout procedure. See vital signs in flow sheet. Tolerated Procedure well. 

## 2020-11-12 NOTE — Anesthesia Procedure Notes (Signed)
Anesthesia Regional Block: Interscalene brachial plexus block   Pre-Anesthetic Checklist: ,, timeout performed, Correct Patient, Correct Site, Correct Laterality, Correct Procedure, Correct Position, site marked, Risks and benefits discussed,  Surgical consent,  Pre-op evaluation,  At surgeon's request and post-op pain management  Laterality: Right  Prep: chloraprep       Needles:  Injection technique: Single-shot  Needle Type: Stimiplex     Needle Length: 9cm  Needle Gauge: 21     Additional Needles:   Procedures:,,,, ultrasound used (permanent image in chart),,,,  Narrative:  Start time: 11/12/2020 12:04 PM End time: 11/12/2020 12:09 PM Injection made incrementally with aspirations every 5 mL.  Performed by: Personally  Anesthesiologist: Lowella Curb, MD

## 2020-11-13 ENCOUNTER — Encounter (HOSPITAL_BASED_OUTPATIENT_CLINIC_OR_DEPARTMENT_OTHER): Payer: Self-pay | Admitting: Orthopedic Surgery

## 2020-11-13 NOTE — Op Note (Signed)
NAME: Tim Briggs, EASLER MEDICAL RECORD NO: [id number] ACCOUNT NO: [id number] DATE OF BIRTH: 24-Sep-2000 FACILITY: [facility] LOCATION: [facility] PHYSICIAN: Elana Alm. Thurston Hole, MD  Operative Report   DATE OF PROCEDURE: 11/12/2020  PREOPERATIVE DIAGNOSIS:   1.  Right shoulder acute traumatic dislocation with anterior Bankart tear. 2.  Right shoulder acute traumatic loose body.  POSTOPERATIVE DIAGNOSIS:   1.  Right shoulder acute traumatic dislocation with anterior Bankart tear. 2.  Right shoulder acute traumatic loose body.  PROCEDURE PERFORMED:   1.  Right shoulder EUA followed by arthroscopically assisted Bankart repair using Arthrex FiberTak anchors x3. 2.  Right shoulder loose body excision.  SURGEON:  Elana Alm. Thurston Hole, MD   ASSISTANT:  Julien Girt, PA  ANESTHESIA:  General.  OPERATIVE TIME:  1 hour.  COMPLICATIONS:  None.  INDICATIONS FOR PROCEDURE: This is a 20 year old who sustained a right shoulder dislocation playing sports over the last eight months.  Exam, x-rays and MRI have revealed an anterior Bankart tear with loose body.  He has failed conservative care, and is  now to undergo arthroscopy and Bankart repair.  DESCRIPTION OF PROCEDURE:  He was brought to the operating room on 11/12/2020 after an interscalene block, was placed in the holding room by Anesthesia.  He was placed on the operating table in supine position.  After being placed under general  anesthesia, his right shoulder was examined.  He had full range of motion with anterior inferior instability on the right, which he did not have on the left.  No posterior instability.  He was then placed in a beach chair position and the shoulder and  arm was prepped in the usual sterile DuraPrep and draped using sterile technique.  Timeout procedure was called and the correct right shoulder identified.  Initially, the posterior arthroscopic portal, the arthroscope with a pump attached was placed in  through an anterior  portal.  An arthroscopic probe was placed.  On initial inspection, the articular cartilage in the glenohumeral joint was intact.  He had a significant Bankart tear and labrum tear in the anterior inferior region of the glenoid from  the 5:30 up to the 2 o'clock position with significant instability.  Superior labrum and biceps tendon anchor was intact.  The biceps tendon was intact.  The posterior labrum was intact.  There were loose bodies of labral tissue and articular cartilage  in the joint, which were removed.  Rotator cuff was thoroughly inspected and it was found to be intact as well.  At this point, through an accessory anterior portal, the Bankart tear and labral tear was repaired with three separate Arthrex FiberTak  sutures, one in the 5:30, one in the 4 o'clock and one in the 3 o'clock position with firm and tight fixation and excellent restoration of normal anatomy and excellent stability restored as well.  At this point, it was felt that all pathology had been  satisfactorily addressed.  The instruments were removed.  Portals were closed with 3-0 nylon suture.  Sterile dressings and a sling applied and the patient was awakened and taken to recovery room in stable condition.  FOLLOW UP CARE:  The patient will be followed as an outpatient on oxycodone and sling.  I will see him back in office in a week for wound check and followup.   SUJ D: 11/12/2020 2:14:59 pm T: 11/13/2020 3:18:00 am  JOB: [id number]/ [id number]

## 2020-12-02 ENCOUNTER — Ambulatory Visit
Admission: RE | Admit: 2020-12-02 | Discharge: 2020-12-02 | Disposition: A | Discharge status: Home / Self Care | Payer: 59 | Source: Ambulatory Visit | Attending: Family Medicine | Admitting: Family Medicine

## 2020-12-02 ENCOUNTER — Other Ambulatory Visit: Payer: Self-pay | Admitting: Family Medicine

## 2020-12-02 DIAGNOSIS — R0781 Pleurodynia: Secondary | ICD-10-CM

## 2021-02-04 ENCOUNTER — Encounter (HOSPITAL_BASED_OUTPATIENT_CLINIC_OR_DEPARTMENT_OTHER): Payer: Self-pay | Admitting: Orthopedic Surgery

## 2021-03-03 IMAGING — CR DG WRIST COMPLETE 3+V*L*
4 series · 4 of 4 positions shown · non-contrast
Comparison: 08/25/2008

CLINICAL DATA: Fell from standing, left wrist pain

EXAM:
LEFT WRIST - COMPLETE 3+ VIEW

[x wrist pa left]
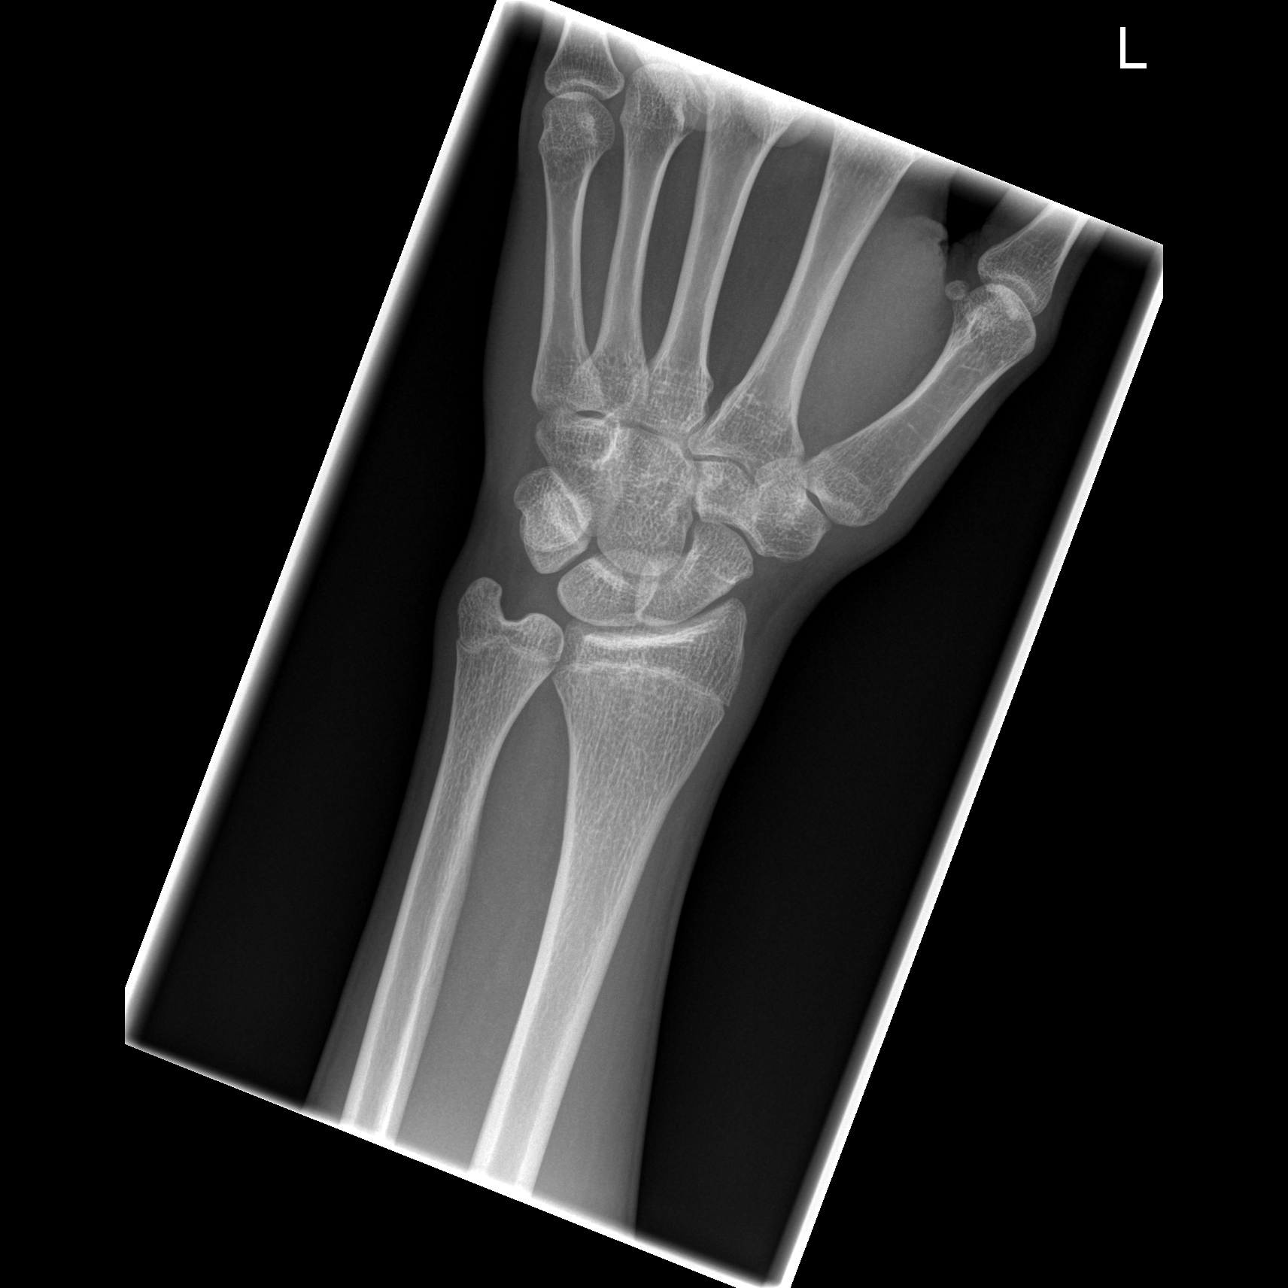

[x wrist obl left]
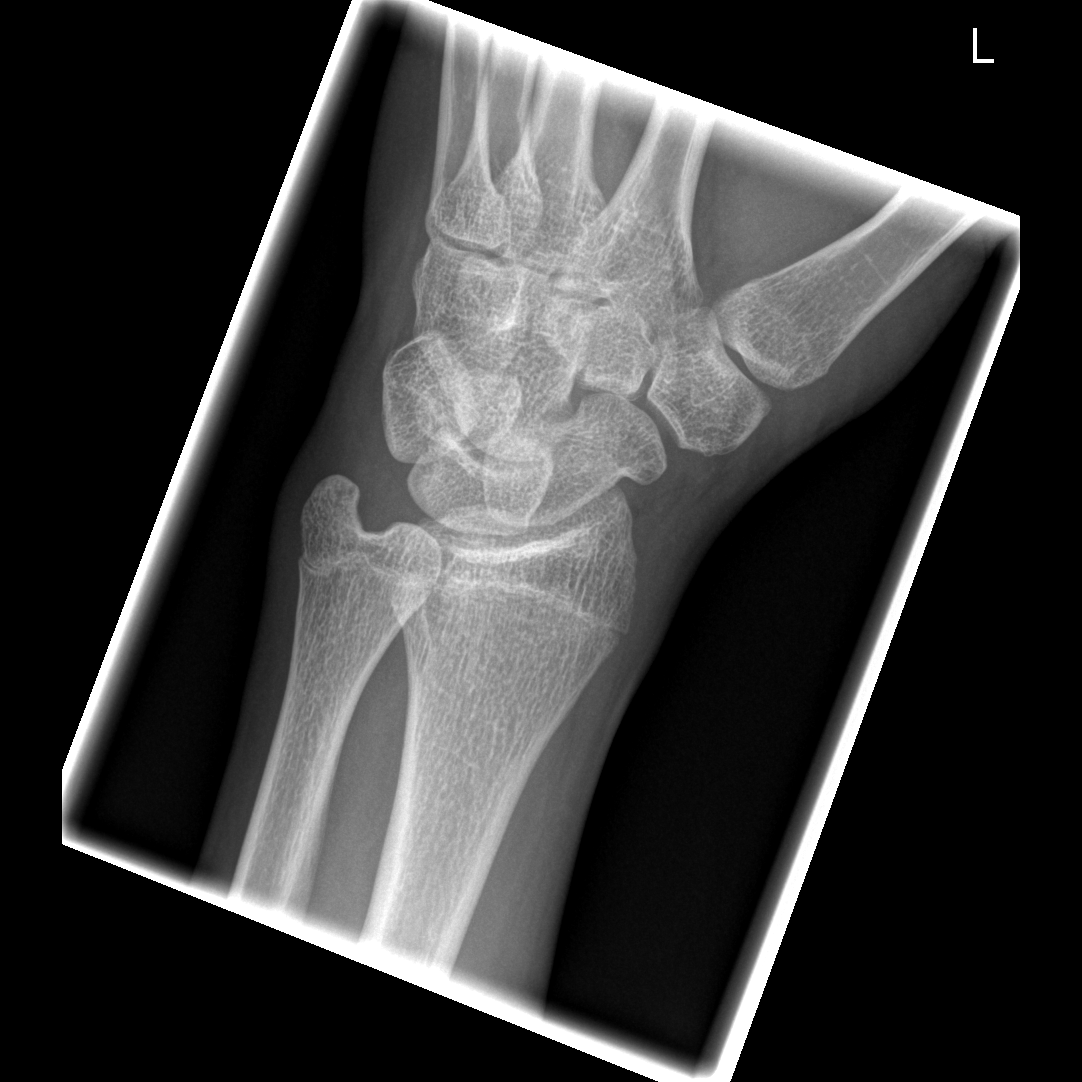

[x wrist lat left]
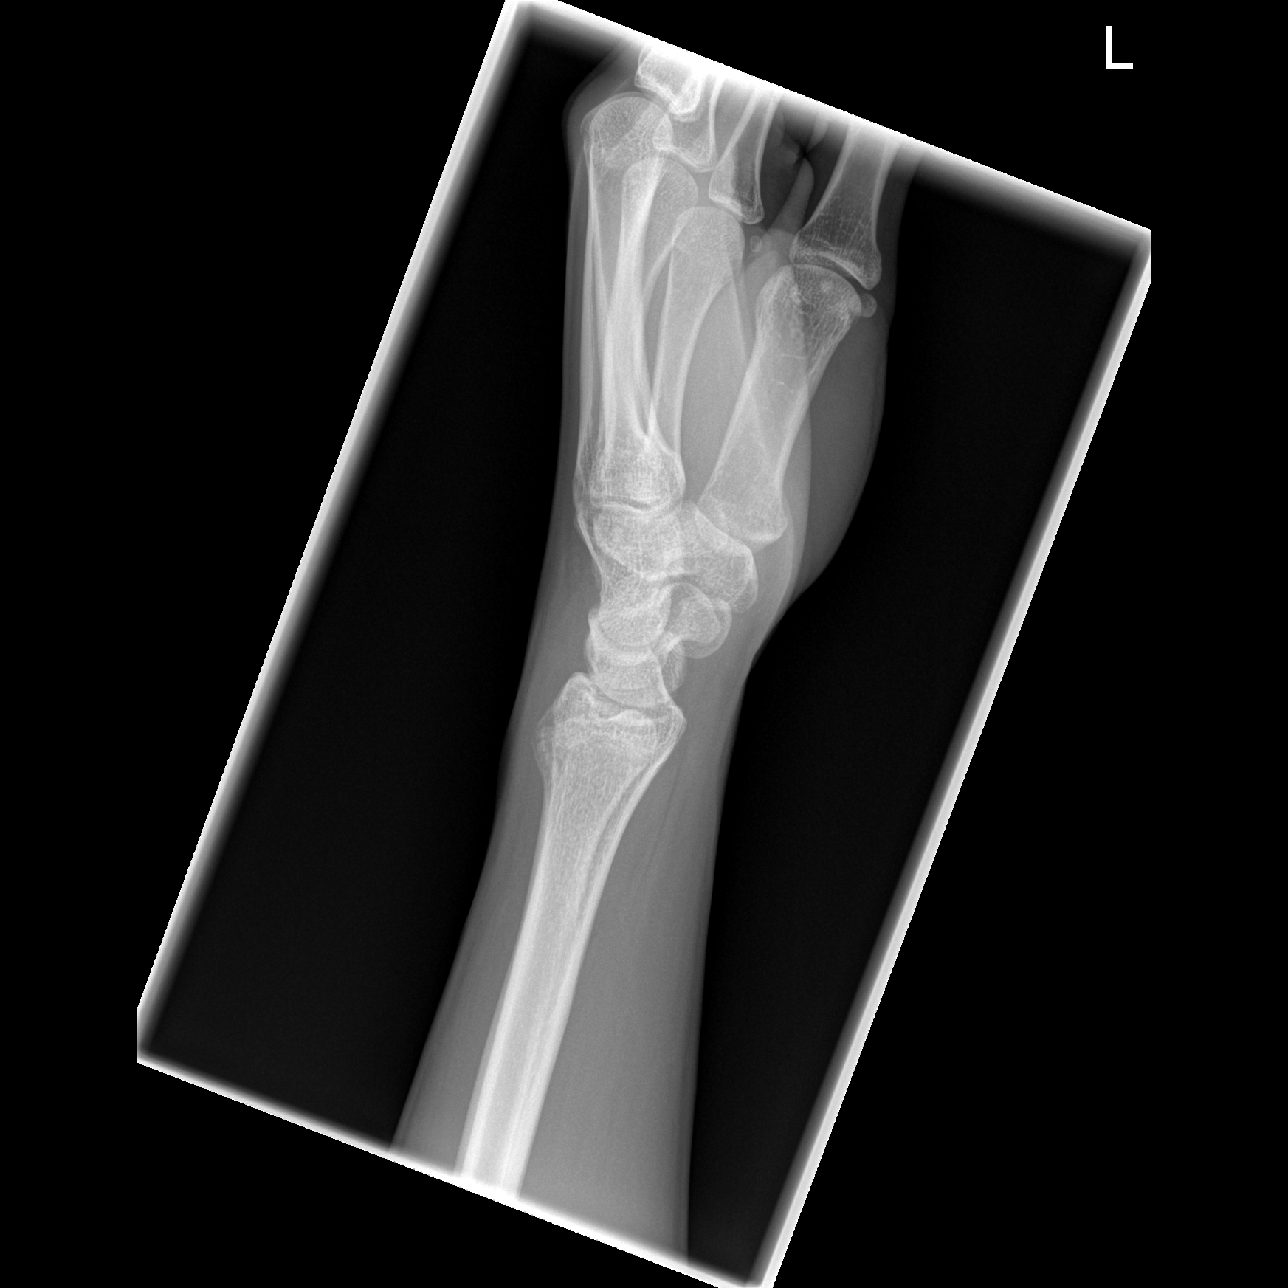

[x navicular]
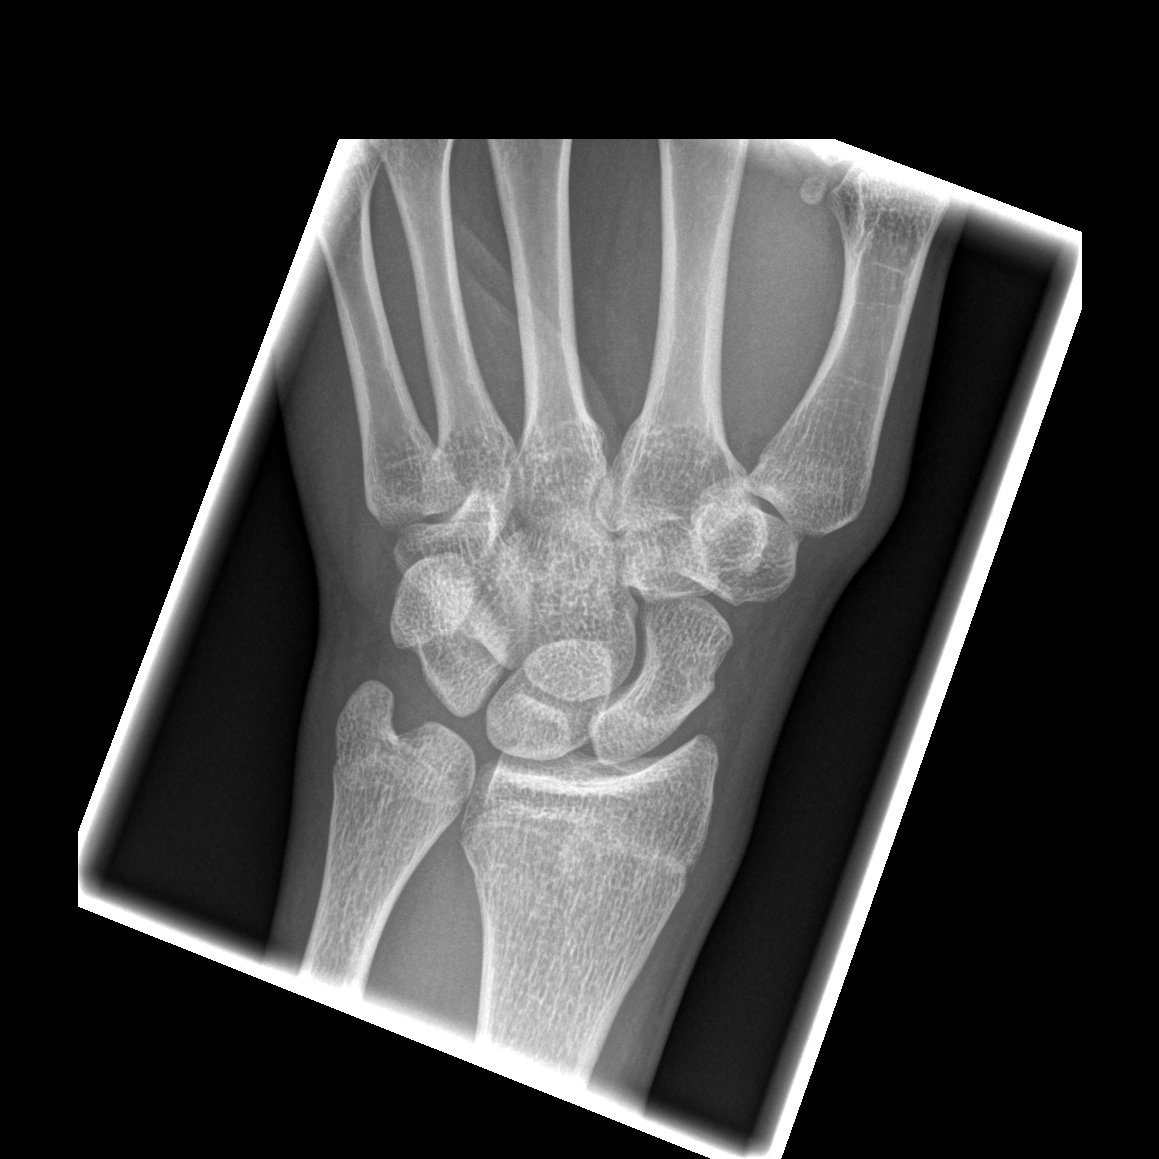

[4 of 4 positions shown; findings below may reference images not displayed]

FINDINGS: Frontal, oblique, and lateral views of the left wrist are obtained.
No acute displaced fracture. Alignment is anatomic. Joint spaces are
well preserved. Soft tissues are normal.
IMPRESSION: 1. No acute displaced fracture.

## 2021-07-30 DIAGNOSIS — R69 Illness, unspecified: Secondary | ICD-10-CM | POA: Diagnosis not present

## 2021-07-30 DIAGNOSIS — H6692 Otitis media, unspecified, left ear: Secondary | ICD-10-CM | POA: Diagnosis not present

## 2021-08-25 DIAGNOSIS — H6982 Other specified disorders of Eustachian tube, left ear: Secondary | ICD-10-CM | POA: Diagnosis not present

## 2021-08-25 DIAGNOSIS — R059 Cough, unspecified: Secondary | ICD-10-CM | POA: Diagnosis not present

## 2021-08-25 DIAGNOSIS — J209 Acute bronchitis, unspecified: Secondary | ICD-10-CM | POA: Diagnosis not present

## 2021-08-25 DIAGNOSIS — J019 Acute sinusitis, unspecified: Secondary | ICD-10-CM | POA: Diagnosis not present

## 2021-09-16 DIAGNOSIS — J189 Pneumonia, unspecified organism: Secondary | ICD-10-CM | POA: Diagnosis not present

## 2021-11-01 DIAGNOSIS — H6982 Other specified disorders of Eustachian tube, left ear: Secondary | ICD-10-CM | POA: Diagnosis not present

## 2021-11-01 DIAGNOSIS — J309 Allergic rhinitis, unspecified: Secondary | ICD-10-CM | POA: Diagnosis not present

## 2021-12-01 DIAGNOSIS — R69 Illness, unspecified: Secondary | ICD-10-CM | POA: Diagnosis not present

## 2022-03-09 DIAGNOSIS — Z20822 Contact with and (suspected) exposure to covid-19: Secondary | ICD-10-CM | POA: Diagnosis not present

## 2022-03-09 DIAGNOSIS — T7840XA Allergy, unspecified, initial encounter: Secondary | ICD-10-CM | POA: Diagnosis not present

## 2022-03-09 DIAGNOSIS — J029 Acute pharyngitis, unspecified: Secondary | ICD-10-CM | POA: Diagnosis not present

## 2022-03-09 DIAGNOSIS — J310 Chronic rhinitis: Secondary | ICD-10-CM | POA: Diagnosis not present

## 2022-03-24 DIAGNOSIS — R519 Headache, unspecified: Secondary | ICD-10-CM | POA: Diagnosis not present

## 2022-03-24 DIAGNOSIS — R051 Acute cough: Secondary | ICD-10-CM | POA: Diagnosis not present

## 2022-03-24 DIAGNOSIS — B349 Viral infection, unspecified: Secondary | ICD-10-CM | POA: Diagnosis not present

## 2022-03-24 DIAGNOSIS — J029 Acute pharyngitis, unspecified: Secondary | ICD-10-CM | POA: Diagnosis not present

## 2022-03-24 DIAGNOSIS — Z20822 Contact with and (suspected) exposure to covid-19: Secondary | ICD-10-CM | POA: Diagnosis not present

## 2022-07-27 DIAGNOSIS — Z79899 Other long term (current) drug therapy: Secondary | ICD-10-CM | POA: Diagnosis not present

## 2022-07-27 DIAGNOSIS — R69 Illness, unspecified: Secondary | ICD-10-CM | POA: Diagnosis not present

## 2022-07-27 DIAGNOSIS — Z0000 Encounter for general adult medical examination without abnormal findings: Secondary | ICD-10-CM | POA: Diagnosis not present

## 2022-09-09 DIAGNOSIS — M24411 Recurrent dislocation, right shoulder: Secondary | ICD-10-CM | POA: Diagnosis not present

## 2022-09-09 DIAGNOSIS — M546 Pain in thoracic spine: Secondary | ICD-10-CM | POA: Diagnosis not present

## 2022-09-15 DIAGNOSIS — M25611 Stiffness of right shoulder, not elsewhere classified: Secondary | ICD-10-CM | POA: Diagnosis not present

## 2022-09-15 DIAGNOSIS — S29012D Strain of muscle and tendon of back wall of thorax, subsequent encounter: Secondary | ICD-10-CM | POA: Diagnosis not present

## 2022-09-15 DIAGNOSIS — M6281 Muscle weakness (generalized): Secondary | ICD-10-CM | POA: Diagnosis not present

## 2022-09-22 DIAGNOSIS — M6281 Muscle weakness (generalized): Secondary | ICD-10-CM | POA: Diagnosis not present

## 2022-09-22 DIAGNOSIS — M25611 Stiffness of right shoulder, not elsewhere classified: Secondary | ICD-10-CM | POA: Diagnosis not present

## 2022-09-22 DIAGNOSIS — S29012D Strain of muscle and tendon of back wall of thorax, subsequent encounter: Secondary | ICD-10-CM | POA: Diagnosis not present

## 2023-07-26 DIAGNOSIS — F902 Attention-deficit hyperactivity disorder, combined type: Secondary | ICD-10-CM | POA: Diagnosis not present

## 2023-07-26 DIAGNOSIS — Z20822 Contact with and (suspected) exposure to covid-19: Secondary | ICD-10-CM | POA: Diagnosis not present

## 2023-07-26 DIAGNOSIS — R059 Cough, unspecified: Secondary | ICD-10-CM | POA: Diagnosis not present

## 2023-07-26 DIAGNOSIS — J029 Acute pharyngitis, unspecified: Secondary | ICD-10-CM | POA: Diagnosis not present

## 2023-08-30 DIAGNOSIS — Z13220 Encounter for screening for lipoid disorders: Secondary | ICD-10-CM | POA: Diagnosis not present

## 2023-08-30 DIAGNOSIS — Z113 Encounter for screening for infections with a predominantly sexual mode of transmission: Secondary | ICD-10-CM | POA: Diagnosis not present

## 2023-08-30 DIAGNOSIS — Z1159 Encounter for screening for other viral diseases: Secondary | ICD-10-CM | POA: Diagnosis not present

## 2023-08-30 DIAGNOSIS — Z79899 Other long term (current) drug therapy: Secondary | ICD-10-CM | POA: Diagnosis not present

## 2023-08-30 DIAGNOSIS — Z0000 Encounter for general adult medical examination without abnormal findings: Secondary | ICD-10-CM | POA: Diagnosis not present

## 2024-03-16 DIAGNOSIS — L03314 Cellulitis of groin: Secondary | ICD-10-CM | POA: Diagnosis not present

## 2024-04-17 DIAGNOSIS — F909 Attention-deficit hyperactivity disorder, unspecified type: Secondary | ICD-10-CM | POA: Diagnosis not present

## 2024-07-12 ENCOUNTER — Other Ambulatory Visit (HOSPITAL_COMMUNITY): Payer: Self-pay | Admitting: Family Medicine

## 2024-07-12 DIAGNOSIS — Z8279 Family history of other congenital malformations, deformations and chromosomal abnormalities: Secondary | ICD-10-CM

## 2024-07-24 ENCOUNTER — Other Ambulatory Visit (HOSPITAL_COMMUNITY): Payer: Self-pay | Admitting: Family Medicine

## 2024-07-24 DIAGNOSIS — Z8279 Family history of other congenital malformations, deformations and chromosomal abnormalities: Secondary | ICD-10-CM

## 2024-07-30 ENCOUNTER — Encounter (HOSPITAL_BASED_OUTPATIENT_CLINIC_OR_DEPARTMENT_OTHER): Payer: Self-pay

## 2024-07-30 ENCOUNTER — Ambulatory Visit (HOSPITAL_BASED_OUTPATIENT_CLINIC_OR_DEPARTMENT_OTHER): Payer: Self-pay
